# Patient Record
Sex: Male | Born: 1990 | Race: White | Hispanic: No | Marital: Single | Smoking: Never smoker
Health system: Southern US, Community
[De-identification: ages and names within clinical notes are randomized; demographics above are authoritative.]

## PROBLEM LIST (undated history)

## (undated) HISTORY — PX: NO PAST SURGERIES: SHX2092

---
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
## 2015-10-21 DIAGNOSIS — L309 Dermatitis, unspecified: Secondary | ICD-10-CM | POA: Insufficient documentation

## 2015-10-23 ENCOUNTER — Encounter: Payer: Self-pay | Admitting: Family Medicine

## 2015-10-23 ENCOUNTER — Ambulatory Visit (INDEPENDENT_AMBULATORY_CARE_PROVIDER_SITE_OTHER): Payer: BLUE CROSS/BLUE SHIELD | Admitting: Family Medicine

## 2015-10-23 VITALS — BP 124/80 | HR 64 | Temp 98.9°F | Resp 16 | Wt 195.0 lb

## 2015-10-23 DIAGNOSIS — K582 Mixed irritable bowel syndrome: Secondary | ICD-10-CM | POA: Diagnosis not present

## 2015-10-23 NOTE — Patient Instructions (Signed)
Diet for Irritable Bowel Syndrome  When you have irritable bowel syndrome (IBS), the foods you eat and your eating habits are very important. IBS may cause various symptoms, such as abdominal pain, constipation, or diarrhea. Choosing the right foods can help ease discomfort caused by these symptoms. Work with your health care provider and dietitian to find the best eating plan to help control your symptoms.  WHAT GENERAL GUIDELINES DO I NEED TO FOLLOW?  · Keep a food diary. This will help you identify foods that cause symptoms. Write down:  · What you eat and when.  · What symptoms you have.  · When symptoms occur in relation to your meals.  · Avoid foods that cause symptoms. Talk with your dietitian about other ways to get the same nutrients that are in these foods.  · Eat more foods that contain fiber. Take a fiber supplement if directed by your dietitian.  · Eat your meals slowly, in a relaxed setting.  · Aim to eat 5-6 small meals per day. Do not skip meals.  · Drink enough fluids to keep your urine clear or pale yellow.  · Ask your health care provider if you should take an over-the-counter probiotic during flare-ups to help restore healthy gut bacteria.  · If you have cramping or diarrhea, try making your meals low in fat and high in carbohydrates. Examples of carbohydrates are pasta, rice, whole grain breads and cereals, fruits, and vegetables.  · If dairy products cause your symptoms to flare up, try eating less of them. You might be able to handle yogurt better than other dairy products because it contains bacteria that help with digestion.  WHAT FOODS ARE NOT RECOMMENDED?  The following are some foods and drinks that may worsen your symptoms:  · Fatty foods, such as French fries.  · Milk products, such as cheese or ice cream.  · Chocolate.  · Alcohol.  · Products with caffeine, such as coffee.  · Carbonated drinks, such as soda.  The items listed above may not be a complete list of foods and beverages to  avoid. Contact your dietitian for more information.  WHAT FOODS ARE GOOD SOURCES OF FIBER?  Your health care provider or dietitian may recommend that you eat more foods that contain fiber. Fiber can help reduce constipation and other IBS symptoms. Add foods with fiber to your diet a little at a time so that your body can get used to them. Too much fiber at once might cause gas and swelling of your abdomen. The following are some foods that are good sources of fiber:  · Apples.  · Peaches.  · Pears.  · Berries.  · Figs.  · Broccoli (raw).  · Cabbage.  · Carrots.  · Raw peas.  · Kidney beans.  · Lima beans.  · Whole grain bread.  · Whole grain cereal.  FOR MORE INFORMATION   International Foundation for Functional Gastrointestinal Disorders: www.iffgd.org  National Institute of Diabetes and Digestive and Kidney Diseases: www.niddk.nih.gov/health-information/health-topics/digestive-diseases/ibs/Pages/facts.aspx     This information is not intended to replace advice given to you by your health care provider. Make sure you discuss any questions you have with your health care provider.     Document Released: 03/04/2004 Document Revised: 01/03/2015 Document Reviewed: 03/15/2014  Elsevier Interactive Patient Education ©2016 [employer]  Irritable Bowel Syndrome, Adult  Irritable bowel syndrome (IBS) is not one specific disease. It is a group of symptoms that affects the organs responsible for digestion (gastrointestinal   or GI tract).   To regulate how your GI tract works, your body sends signals back and forth between your intestines and your brain. If you have IBS, there may be a problem with these signals. As a result, your GI tract does not function normally. Your intestines may become more sensitive and overreact to certain things. This is especially true when you eat certain foods or when you are under stress.   There are four types of IBS. These may be determined based on the consistency of your stool:   · IBS with  diarrhea.    · IBS with constipation.    · Mixed IBS.    · Unsubtyped IBS.    It is important to know which type of IBS you have. Some treatments are more likely to be helpful for certain types of IBS.   CAUSES   The exact cause of IBS is not known.  RISK FACTORS  You may have a higher risk of IBS if:  · You are a woman.  · You are younger than 24 years old.  · You have a family history of IBS.  · You have mental health problems.  · You have had bacterial infection of your GI tract.  SIGNS AND SYMPTOMS   Symptoms of IBS vary from person to person. The main symptom is abdominal pain or discomfort. Additional symptoms usually include one or more of the following:   · Diarrhea, constipation, or both.    · Abdominal swelling or bloating.    · Feeling full or sick after eating a small or regular-size meal.    · Frequent gas.    · Mucus in the stool.    · A feeling of having more stool left after a bowel movement.    Symptoms tend to come and go. They may be associated with stress, psychiatric conditions, or nothing at all.   DIAGNOSIS   There is no specific test to diagnose IBS. Your health care provider will make a diagnosis based on a physical exam, medical history, and your symptoms. You may have other tests to rule out other conditions that may be causing your symptoms. These may include:   · Blood tests.    · X-rays.    · CT scan.  · Endoscopy and colonoscopy. This is a test in which your GI tract is viewed with a long, thin, flexible tube.  TREATMENT  There is no cure for IBS, but treatment can help relieve symptoms. IBS treatment often includes:   · Changes to your diet, such as:    Eating more fiber.    Avoiding foods that cause symptoms.    Drinking more water.    Eating regular, medium-sized portioned meals.  · Medicines. These may include:    Fiber supplements if you have constipation.    Medicine to control diarrhea (antidiarrheal medicines).    Medicine to help control muscle spasms in your GI tract  (antispasmodic medicines).    Medicines to help with any mental health issues, such as antidepressants or tranquilizers.  · Therapy.    Talk therapy may help with anxiety, depression, or other mental health issues that can make IBS symptoms worse.  · Stress reduction.    Managing your stress can help keep symptoms under control.  HOME CARE INSTRUCTIONS   · Take medicines only as directed by your health care provider.  · Eat a healthy diet.    Avoid foods and drinks with added sugar.    Include more whole grains, fruits, and vegetables gradually into your diet.   This may be especially helpful if you have IBS with constipation.    Avoid any foods and drinks that make your symptoms worse. These may include dairy products and caffeinated or carbonated drinks.    Do not eat large meals.    Drink enough fluid to keep your urine clear or pale yellow.  · Exercise regularly. Ask your health care provider for recommendations of good activities for you.  · Keep all follow-up visits as directed by your health care provider. This is important.  SEEK MEDICAL CARE IF:   · You have constant pain.  · You have trouble or pain with swallowing.  · You have worsening diarrhea.  SEEK IMMEDIATE MEDICAL CARE IF:   · You have severe and worsening abdominal pain.    · You have diarrhea and:      You have a rash, stiff neck, or severe headache.      You are irritable, sleepy, or difficult to awaken.      You are weak, dizzy, or extremely thirsty.    · You have bright red blood in your stool or you have black tarry stools.    · You have unusual abdominal swelling that is painful.    · You vomit continuously.    · You vomit blood (hematemesis).    · You have both abdominal pain and a fever.         This information is not intended to replace advice given to you by your health care provider. Make sure you discuss any questions you have with your health care provider.     Document Released: 12/13/2005 Document Revised: 01/03/2015 Document  Reviewed: 08/30/2014  Elsevier Interactive Patient Education ©2016 [employer] Inc[employer]

## 2015-10-23 NOTE — Progress Notes (Signed)
Patient ID: Stanley Johnson, male   DOB: 02/21/1991, 24 y.o.   MRN: [id number] Name: Stanley Johnson   MRN: [id number]    DOB: 05/04/1991   Date:10/23/2015       Progress Note  Subjective  Chief Complaint  Chief Complaint  Patient presents with  . Abdominal Pain    Abdominal Pain This is a recurrent problem. The current episode started 1 to 4 weeks ago. The problem occurs every several days. Duration: 2-3 times a day. The problem has been unchanged. The pain is located in the generalized abdominal region. The quality of the pain is cramping and a sensation of fullness. The abdominal pain does not radiate. Associated symptoms include constipation, diarrhea, flatus and frequency. Pertinent negatives include no hematochezia or vomiting. Nothing aggravates the pain. The pain is relieved by bowel movements.   Patient Active Problem List   Diagnosis Date Noted  . 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 Breast enlargement 12/16/2008   Past Surgical History  Procedure Laterality Date  . No past surgeries     Family History  Problem Relation Age of Onset  . Healthy Mother   . Healthy Father   . Healthy Brother   . Alcohol abuse Maternal Grandfather   . Arthritis Paternal Grandmother   . Hypertension Paternal Grandmother   . Hypertension Paternal Grandfather    Social History  Substance Use Topics  . Smoking status: Never Smoker   . Smokeless tobacco: Never Used  . Alcohol Use: 1-2 beers a week    Current outpatient prescriptions:  .  cetirizine (ZYRTEC) 10 MG tablet, Take 10 mg by mouth daily., Disp: , Rfl:  .  fluticasone (FLONASE) 50 MCG/ACT nasal spray, Place into both nostrils daily., Disp: , Rfl:   No Known Allergies  Review of Systems  Constitutional: Negative.   HENT: Negative.   Eyes: Negative.   Respiratory: Negative.   Cardiovascular: Negative.   Gastrointestinal: Positive for abdominal pain, diarrhea, constipation and flatus. Negative for vomiting and hematochezia.   Genitourinary: Positive for frequency.  Musculoskeletal: Negative.   Skin: Negative.   Neurological: Negative.   Endo/Heme/Allergies: Negative.   Psychiatric/Behavioral: Negative.    Objective  Filed Vitals:   10/23/15 0811  BP: 124/80  Pulse: 64  Temp: 98.9 F (37.2 C)  TempSrc: Oral  Resp: 16  Weight: 195 lb (88.451 kg)  SpO2: 98%   Physical Exam  Constitutional: He is oriented to person, place, and time and well-developed, well-nourished, and in no distress.  HENT:  Head: Normocephalic and atraumatic.  Right Ear: External ear normal.  Left Ear: External ear normal.  Eyes: Conjunctivae and EOM are normal.  Neck: Neck supple.  Cardiovascular: Normal rate.   Pulmonary/Chest: Effort normal and breath sounds normal.  Abdominal: Soft. Bowel sounds are normal. He exhibits no mass. There is no tenderness. There is no rebound and no guarding.  Musculoskeletal: Normal range of motion.  Neurological: He is alert and oriented to person, place, and time.  Psychiatric: Mood, affect and judgment normal.    Assessment & Plan  1. Irritable bowel syndrome with both constipation and diarrhea Alternating constipation and diarrhea over the past 2-3 weeks. Fiber has helped. Suspect IBS and recommend probiotic with extra fiber in diet. May add Gas-X prn or Imodium-AD prn. Will get routine labs and consider GI referral if symptoms persist or worsen. Recheck pending lab reports. - COMPLETE METABOLIC PANEL WITH GFR - CBC with Differential/Platelet - TSH

## 2016-05-13 ENCOUNTER — Ambulatory Visit (INDEPENDENT_AMBULATORY_CARE_PROVIDER_SITE_OTHER): Payer: BLUE CROSS/BLUE SHIELD | Admitting: Family Medicine

## 2016-05-13 ENCOUNTER — Encounter: Payer: Self-pay | Admitting: Family Medicine

## 2016-05-13 VITALS — BP 148/90 | HR 92 | Temp 98.8°F | Resp 16 | Wt 198.0 lb

## 2016-05-13 DIAGNOSIS — R350 Frequency of micturition: Secondary | ICD-10-CM

## 2016-05-13 DIAGNOSIS — N451 Epididymitis: Secondary | ICD-10-CM

## 2016-05-13 LAB — POCT URINALYSIS DIPSTICK
BILIRUBIN UA: NEGATIVE
Glucose, UA: NEGATIVE
Ketones, UA: NEGATIVE
LEUKOCYTES UA: NEGATIVE
NITRITE UA: NEGATIVE
PH UA: 6
RBC UA: NEGATIVE
Spec Grav, UA: 1.01
UROBILINOGEN UA: 0.2

## 2016-05-13 MED ORDER — DOXYCYCLINE HYCLATE 100 MG PO TABS: 100.0000 mg | ORAL_TABLET | Freq: Two times a day (BID) | ORAL | Status: DC

## 2016-05-13 NOTE — Patient Instructions (Signed)
Epididymitis °Epididymitis is swelling (inflammation) of the epididymis. The epididymis is a cord-like structure that is located along the top and back part of the testicle. It collects and stores sperm from the testicle. °This condition can also cause pain and swelling of the testicle and scrotum. Symptoms usually start suddenly (acute epididymitis). Sometimes epididymitis starts gradually and lasts for a while (chronic epididymitis). This type may be harder to treat. °CAUSES °In men 35 and younger, this condition is usually caused by a bacterial infection or sexually transmitted disease (STD), such as: °· Gonorrhea. °· Chlamydia.   °In men 35 and older who do not have anal sex, this condition is usually caused by bacteria from a blockage or abnormalities in the urinary system. These can result from: °· Having a tube placed into the bladder (urinary catheter). °· Having an enlarged or inflamed prostate gland. °· Having recent urinary tract surgery. °In men who have a condition that weakens the body's defense system (immune system), such as HIV, this condition can be caused by:  °· Other bacteria, including tuberculosis and syphilis. °· Viruses. °· Fungi. °Sometimes this condition occurs without infection. That may happen if urine flows backward into the epididymis after heavy lifting or straining. °RISK FACTORS °This condition is more likely to develop in men: °· Who have unprotected sex with more than one partner. °· Who have anal sex.   °· Who have recently had surgery.   °· Who have a urinary catheter. °· Who have urinary problems. °· Who have a suppressed immune system.   °SYMPTOMS  °This condition usually begins suddenly with chills, fever, and pain behind the scrotum and in the testicle. Other symptoms include:  °· Swelling of the scrotum, testicle, or both. °· Pain when ejaculating or urinating. °· Pain in the back or belly. °· Nausea. °· Itching and discharge from the penis. °· Frequent need to pass  urine. °· Redness and tenderness of the scrotum. °DIAGNOSIS °Your health care provider can diagnose this condition based on your symptoms and medical history. Your health care provider will also do a physical exam to ask about your symptoms and check your scrotum and testicle for swelling, pain, and redness. You may also have other tests, including:   °· Examination of discharge from the penis. °· Urine tests for infections, such as STDs.   °Your health care provider may test you for other STDs, including HIV.  °TREATMENT °Treatment for this condition depends on the cause. If your condition is caused by a bacterial infection, oral antibiotic medicine may be prescribed. If the bacterial infection has spread to your blood, you may need to receive IV antibiotics. Nonbacterial epididymitis is treated with home care that includes bed rest and elevation of the scrotum. °Surgery may be needed to treat: °· Bacterial epididymitis that causes pus to build up in the scrotum (abscess). °· Chronic epididymitis that has not responded to other treatments. °HOME CARE INSTRUCTIONS °Medicines  °· Take over-the-counter and prescription medicines only as told by your health care provider.   °· If you were prescribed an antibiotic medicine, take it as told by your health care provider. Do not stop taking the antibiotic even if your condition improves. °Sexual Activity  °· If your epididymitis was caused by an STD, avoid sexual activity until your treatment is complete. °· Inform your sexual partner or partners if you test positive for an STD. They may need to be treated. Do not engage in sexual activity with your partner or partners until their treatment is completed. °General Instructions  °· Return to your normal activities as told   by your health care provider. Ask your health care provider what activities are safe for you. °· Keep your scrotum elevated and supported while resting. Ask your health care provider if you should wear a  scrotal support, such as a jockstrap. Wear it as told by your health care provider. °· If directed, apply ice to the affected area:   °¨ Put ice in a plastic bag. °¨ Place a towel between your skin and the bag. °¨ Leave the ice on for 20 minutes, 2-3 times per day. °· Try taking a sitz bath to help with discomfort. This is a warm water bath that is taken while you are sitting down. The water should only come up to your hips and should cover your buttocks. Do this 3-4 times per day or as told by your health care provider. °· Keep all follow-up visits as told by your health care provider. This is important. °SEEK MEDICAL CARE IF:  °· You have a fever.   °· Your pain medicine is not helping.   °· Your pain is getting worse.   °· Your symptoms do not improve within three days. °  °This information is not intended to replace advice given to you by your health care provider. Make sure you discuss any questions you have with your health care provider. °  °Document Released: 12/10/2000 Document Revised: 09/03/2015 Document Reviewed: 04/30/2015 °Elsevier Interactive Patient Education ©2016 [employer] Inc. ° °

## 2016-05-13 NOTE — Progress Notes (Signed)
Patient ID: Stanley Johnson, male   DOB: [birth year], 25 y.o.   MRN: [id number]       Patient: Stanley Johnson Male    DOB: [birth year]   25 y.o.   MRN: [id number] Visit Date: 05/13/2016  Today's Provider: Dortha Kern, PA   Chief Complaint  Patient presents with  . Urinary Frequency  . Testicle Pain   Subjective:    HPI  Patient has had issues with frequent urination especially in the past week. He has to use the bathroom every 20 to 30 minutes. Urgency is present. He has noticed in the past 2 days that his right testicle was tender but no redness or rash present. He took Ibuprofen 2 times and the discomfort sensation resolved but then last night or this morning noticed his left testicle having some discomfort now. He has not noticed any blood or discharge. He has not been sexually active in 1 year now. He has had nocturia for the past 2 months or so.    No past medical history on file. Patient Active Problem List   Diagnosis Date Noted  . 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 Breast enlargement 12/16/2008   Past Surgical History  Procedure Laterality Date  . No past surgeries     Family History  Problem Relation Age of Onset  . Healthy Mother   . Healthy Father   . Healthy Brother   . Alcohol abuse Maternal Grandfather   . Arthritis Paternal Grandmother   . Hypertension Paternal Grandmother   . Hypertension Paternal Grandfather     No Known Allergies   Previous Medications   CETIRIZINE (ZYRTEC) 10 MG TABLET    Take 10 mg by mouth daily.   FLUTICASONE (FLONASE) 50 MCG/ACT NASAL SPRAY    Place into both nostrils daily.    Review of Systems  Constitutional: Negative.   Respiratory: Negative.   Cardiovascular: Negative.   Genitourinary: Positive for urgency, frequency and testicular pain.    Social History  Substance Use Topics  . Smoking status: Never Smoker   . Smokeless tobacco: Never Used  . Alcohol Use: No   Objective:   BP 148/90 mmHg  Pulse 92  Temp(Src) 98.8 F  (37.1 C)  Resp 16  Wt 198 lb (89.812 kg) BP Readings from Last 3 Encounters:  05/13/16 148/90  10/23/15 124/80   Urinalysis    Component Value Date/Time   BILIRUBINUR negative 05/13/2016 0821   PROTEINUR trace 05/13/2016 0821   UROBILINOGEN 0.2 05/13/2016 0821   NITRITE negative 05/13/2016 0821   LEUKOCYTESUR Negative 05/13/2016 0821     Physical Exam  Constitutional: He is oriented to person, place, and time. He appears well-developed and well-nourished. No distress.  HENT:  Head: Normocephalic and atraumatic.  Right Ear: Hearing normal.  Left Ear: Hearing normal.  Nose: Nose normal.  Eyes: Conjunctivae and lids are normal. Right eye exhibits no discharge. Left eye exhibits no discharge. No scleral icterus.  Cardiovascular: Normal rate and regular rhythm.   Pulmonary/Chest: Effort normal and breath sounds normal. No respiratory distress.  Abdominal: Soft. Bowel sounds are normal.  Genitourinary: Penis normal.  Mild soreness in left upper spermatic cords and upper posterior pole of left testicle.  Musculoskeletal: Normal range of motion.  Neurological: He is alert and oriented to person, place, and time.  Skin: Skin is intact. No lesion and no rash noted.  Psychiatric: He has a normal mood and affect. His speech is normal and behavior is normal. Thought content normal.  Assessment & Plan:     1. Frequent urination Polyuria without discomfort. No known family history of diabetes. Will check labs. Urinalysis did not show any blood or signs of infection. Recheck pending reports.  - POCT urinalysis dipstick - CBC with Differential/Platelet - Comprehensive metabolic panel - Hemoglobin A1c  2. Epididymitis Tenderness in the left testicle/spermatic cords. Suspect epididymitis. Will treat with Doxycycline BID and Ibuprofen prn. Hot soaks may be helpful, also. Recheck prn.       Dortha Kernennis Chrismon, PA  [facility]

## 2016-05-14 LAB — CBC WITH DIFFERENTIAL/PLATELET
BASOS ABS: 0 10*3/uL (ref 0.0–0.2)
BASOS: 0 %
EOS (ABSOLUTE): 0 10*3/uL (ref 0.0–0.4)
Eos: 1 %
HEMOGLOBIN: 15.4 g/dL (ref 12.6–17.7)
Hematocrit: 43.7 % (ref 37.5–51.0)
IMMATURE GRANS (ABS): 0 10*3/uL (ref 0.0–0.1)
Immature Granulocytes: 0 %
LYMPHS ABS: 1.1 10*3/uL (ref 0.7–3.1)
Lymphs: 27 %
MCH: 32.3 pg (ref 26.6–33.0)
MCHC: 35.2 g/dL (ref 31.5–35.7)
MCV: 92 fL (ref 79–97)
MONOCYTES: 10 %
Monocytes Absolute: 0.4 10*3/uL (ref 0.1–0.9)
NEUTROS ABS: 2.6 10*3/uL (ref 1.4–7.0)
Neutrophils: 62 %
Platelets: 142 10*3/uL — ABNORMAL LOW (ref 150–379)
RBC: 4.77 x10E6/uL (ref 4.14–5.80)
RDW: 13.4 % (ref 12.3–15.4)
WBC: 4.2 10*3/uL (ref 3.4–10.8)

## 2016-05-14 LAB — COMPREHENSIVE METABOLIC PANEL
ALBUMIN: 4.8 g/dL (ref 3.5–5.5)
ALK PHOS: 53 IU/L (ref 39–117)
ALT: 21 IU/L (ref 0–44)
AST: 19 IU/L (ref 0–40)
Albumin/Globulin Ratio: 1.9 (ref 1.2–2.2)
BUN / CREAT RATIO: 10 (ref 9–20)
BUN: 10 mg/dL (ref 6–20)
Bilirubin Total: 0.6 mg/dL (ref 0.0–1.2)
CO2: 25 mmol/L (ref 18–29)
CREATININE: 0.99 mg/dL (ref 0.76–1.27)
Calcium: 9.6 mg/dL (ref 8.7–10.2)
Chloride: 102 mmol/L (ref 96–106)
GFR calc Af Amer: 122 mL/min/{1.73_m2} (ref >59)
GFR, EST NON AFRICAN AMERICAN: 105 mL/min/{1.73_m2} (ref >59)
GLUCOSE: 82 mg/dL (ref 65–99)
Globulin, Total: 2.5 g/dL (ref 1.5–4.5)
Potassium: 4.2 mmol/L (ref 3.5–5.2)
SODIUM: 142 mmol/L (ref 134–144)
TOTAL PROTEIN: 7.3 g/dL (ref 6.0–8.5)

## 2016-05-14 LAB — HEMOGLOBIN A1C
Est. average glucose Bld gHb Est-mCnc: 111 mg/dL
HEMOGLOBIN A1C: 5.5 % (ref 4.8–5.6)

## 2017-01-10 ENCOUNTER — Encounter: Payer: Self-pay | Admitting: Family Medicine

## 2017-01-10 ENCOUNTER — Ambulatory Visit (INDEPENDENT_AMBULATORY_CARE_PROVIDER_SITE_OTHER): Payer: BLUE CROSS/BLUE SHIELD | Admitting: Family Medicine

## 2017-01-10 VITALS — BP 142/98 | HR 73 | Temp 98.0°F | Resp 14 | Ht 72.0 in | Wt 198.0 lb

## 2017-01-10 DIAGNOSIS — R030 Elevated blood-pressure reading, without diagnosis of hypertension: Secondary | ICD-10-CM | POA: Diagnosis not present

## 2017-01-10 DIAGNOSIS — L739 Follicular disorder, unspecified: Secondary | ICD-10-CM

## 2017-01-10 NOTE — Progress Notes (Addendum)
   Patient: Stanley Johnson Male    DOB: 10/08/1991   25 y.o.   MRN: [id number] Visit Date: 01/10/2017  Today's Provider: Dortha Kernennis Chrismon, PA   Chief Complaint  Patient presents with  . Skin Problem   Subjective:    HPI Patient is here today for a red raise area above left eye lid. Patient reports noticing the spot around 2 weeks ago. He has tried warm compresses which have helped.  Patient has a PMH of calcium build up and wanted to be sure problem is not reoccurring.   Patient Active Problem List   Diagnosis Date Noted  . 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 Breast enlargement 12/16/2008   Past Surgical History:  Procedure Laterality Date  . NO PAST SURGERIES     Family History  Problem Relation Age of Onset  . Healthy Mother   . Healthy Father   . Healthy Brother   . Alcohol abuse Maternal Grandfather   . Arthritis Paternal Grandmother   . Hypertension Paternal Grandmother   . Hypertension Paternal Grandfather    No Known Allergies   Previous Medications   CETIRIZINE (ZYRTEC) 10 MG TABLET    Take 10 mg by mouth daily.   FLUTICASONE (FLONASE) 50 MCG/ACT NASAL SPRAY    Place into both nostrils daily.    Review of Systems  Constitutional: Negative.   Respiratory: Negative.   Cardiovascular: Negative.   Skin:       Red raise area above left eye lid     Social History  Substance Use Topics  . Smoking status: Never Smoker  . Smokeless tobacco: Never Used  . Alcohol use No   Objective:   BP (!) 142/98 (BP Location: Right Arm, Patient Position: Sitting, Cuff Size: Normal)   Pulse 73   Temp 98 F (36.7 C) (Oral)   Resp 14   Ht 6' (1.829 m)   Wt 198 lb (89.8 kg)   SpO2 99%   BMI 26.85 kg/m  BP Readings from Last 3 Encounters:  01/10/17 (!) 142/98  05/13/16 (!) 148/90  10/23/15 124/80   Wt Readings from Last 3 Encounters:  01/10/17 198 lb (89.8 kg)  05/13/16 198 lb (89.8 kg)  10/23/15 195 lb (88.5 kg)    Physical Exam  Constitutional: He is oriented to person,  place, and time. He appears well-developed and well-nourished. No distress.  HENT:  Head: Normocephalic and atraumatic.  Right Ear: Hearing normal.  Left Ear: Hearing normal.  Nose: Nose normal.  Eyes: Conjunctivae, EOM and lids are normal. Right eye exhibits no discharge. Left eye exhibits no discharge. No scleral icterus.  Neck: Neck supple.  Pulmonary/Chest: Effort normal. No respiratory distress.  Musculoskeletal: Normal range of motion.  Neurological: He is alert and oriented to person, place, and time.  Skin: Skin is intact. No lesion noted.  Pinpoint pustule with cystic reaction subcutaneously. Surrounding erythema approximately 0.5 cm in diameter. No local lymphadenopathy.   Psychiatric: He has a normal mood and affect. His speech is normal and behavior is normal. Thought content normal.      Assessment & Plan:      1. Folliculitis Early folliculitis versus acne cyst in the left eyebrow. May use Neosporin cream and warm compresses prn. Recheck if it does not continue to resolve in the next week.  2. Elevated BP without diagnosis of hypertension Recommend restricted sodium/salt intake, decaffeinate beverages, no ETOH or tobacco use. Exercise regularly and recheck BP in 2 months.

## 2017-01-17 ENCOUNTER — Ambulatory Visit: Payer: BLUE CROSS/BLUE SHIELD | Admitting: Family Medicine

## 2017-03-10 ENCOUNTER — Encounter: Payer: Self-pay | Admitting: Family Medicine

## 2017-03-10 ENCOUNTER — Ambulatory Visit (INDEPENDENT_AMBULATORY_CARE_PROVIDER_SITE_OTHER): Payer: BLUE CROSS/BLUE SHIELD | Admitting: Family Medicine

## 2017-03-10 VITALS — BP 152/98 | HR 84 | Temp 98.2°F | Resp 16 | Wt 193.0 lb

## 2017-03-10 DIAGNOSIS — I10 Essential (primary) hypertension: Secondary | ICD-10-CM

## 2017-03-10 MED ORDER — LISINOPRIL 5 MG PO TABS: 5.0000 mg | ORAL_TABLET | Freq: Every day | ORAL | 3 refills | Status: DC

## 2017-03-10 NOTE — Patient Instructions (Signed)

## 2017-03-10 NOTE — Progress Notes (Signed)
Patient: Stanley Johnson Male    DOB: Apr 01, 1991   26 y.o.   MRN: [id number] Visit Date: 03/10/2017  Today's Provider: Dortha Kern, PA   Chief Complaint  Patient presents with  . Elevated Blood Pressure   Subjective:    HPI     Follow up for Elevated Blood Pressure without Diagnosis of Hypertension  The patient was last seen for this 2 months ago. Changes made at last visit include restrict sodium/salt intake, use decaffeinated beverages, no ETOH of tobacco use.  He reports fair compliance with treatment. Pt has decreased alcohol use, and he has increased his time at the gym. Is now going 3 times per week for 30-60 minutes. Pt mostly does cardio while at the gym. Pt also reports that his stress level seems decreased.   ------------------------------------------------------------------------------------  Patient Active Problem List   Diagnosis Date Noted  . 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 Breast enlargement 12/16/2008   Past Surgical History:  Procedure Laterality Date  . NO PAST SURGERIES      Family History  Problem Relation Age of Onset  . Healthy Mother   . Healthy Father   . Healthy Brother   . Alcohol abuse Maternal Grandfather   . Arthritis Paternal Grandmother   . Hypertension Paternal Grandmother   . Hypertension Paternal Grandfather    No Known Allergies  Current Outpatient Prescriptions:  .  cetirizine (ZYRTEC) 10 MG tablet, Take 10 mg by mouth daily., Disp: , Rfl:  .  FIBER ADULT GUMMIES 2 g CHEW, Chew by mouth., Disp: , Rfl:  .  fluticasone (FLONASE) 50 MCG/ACT nasal spray, Place into both nostrils daily., Disp: , Rfl:  .  Probiotic Product (PROBIOTIC ADVANCED PO), Take by mouth., Disp: , Rfl:   Review of Systems  Constitutional: Negative for activity change, appetite change, chills, diaphoresis, fatigue, fever and unexpected weight change.  Respiratory: Negative for shortness of breath.   Cardiovascular: Negative for chest pain, palpitations  and leg swelling.  Neurological: Negative for light-headedness and headaches.    Social History  Substance Use Topics  . Smoking status: Never Smoker  . Smokeless tobacco: Never Used  . Alcohol use 0.0 oz/week     Comment: occasionally   Objective:   BP (!) 152/98 (BP Location: Right Arm, Patient Position: Sitting, Cuff Size: Normal)   Pulse 84   Temp 98.2 F (36.8 C) (Oral)   Resp 16   Wt 193 lb (87.5 kg)   BMI 26.18 kg/m  Vitals:   03/10/17 0810  BP: (!) 152/98  Pulse: 84  Resp: 16  Temp: 98.2 F (36.8 C)  TempSrc: Oral  Weight: 193 lb (87.5 kg)     Physical Exam  Constitutional: He appears well-developed and well-nourished.  HENT:  Head: Normocephalic.  Eyes: Conjunctivae are normal.  Neck: Neck supple.  Cardiovascular: Normal rate and regular rhythm.   Pulmonary/Chest: Effort normal and breath sounds normal.  Abdominal: Soft. Bowel sounds are normal.      Assessment & Plan:     1. Essential hypertension Has been exercising, limiting caffeine and sodium in diet but BP still elevated. Will start Lisinopril and recheck in 6 weeks. Schedule fasting labs next week. - lisinopril (PRINIVIL,ZESTRIL) 5 MG tablet; Take 1 tablet (5 mg total) by mouth daily.  Dispense: 30 tablet; Refill: 3 - CBC with Differential/Platelet - Comprehensive metabolic panel - TSH - Lipid panel      Patient seen and examined by Julieanne Manson, MD, and  note scribed by Allene DillonEmily Drozdowski, CMA.  Dortha Kernennis Abem Shaddix, PA  [facility]

## 2017-03-18 ENCOUNTER — Telehealth: Payer: Self-pay

## 2017-03-18 LAB — COMPREHENSIVE METABOLIC PANEL
ALBUMIN: 4.8 g/dL (ref 3.5–5.5)
ALT: 21 IU/L (ref 0–44)
AST: 23 IU/L (ref 0–40)
Albumin/Globulin Ratio: 1.9 (ref 1.2–2.2)
Alkaline Phosphatase: 54 IU/L (ref 39–117)
BUN / CREAT RATIO: 9 (ref 9–20)
BUN: 8 mg/dL (ref 6–20)
Bilirubin Total: 0.6 mg/dL (ref 0.0–1.2)
CALCIUM: 9.6 mg/dL (ref 8.7–10.2)
CO2: 23 mmol/L (ref 18–29)
CREATININE: 0.86 mg/dL (ref 0.76–1.27)
Chloride: 103 mmol/L (ref 96–106)
GFR calc non Af Amer: 120 mL/min/{1.73_m2} (ref >59)
GFR, EST AFRICAN AMERICAN: 138 mL/min/{1.73_m2} (ref >59)
GLUCOSE: 88 mg/dL (ref 65–99)
Globulin, Total: 2.5 g/dL (ref 1.5–4.5)
Potassium: 4.7 mmol/L (ref 3.5–5.2)
Sodium: 142 mmol/L (ref 134–144)
TOTAL PROTEIN: 7.3 g/dL (ref 6.0–8.5)

## 2017-03-18 LAB — CBC WITH DIFFERENTIAL/PLATELET
BASOS ABS: 0 10*3/uL (ref 0.0–0.2)
Basos: 0 %
EOS (ABSOLUTE): 0.1 10*3/uL (ref 0.0–0.4)
Eos: 1 %
Hematocrit: 43.4 % (ref 37.5–51.0)
Hemoglobin: 14.7 g/dL (ref 13.0–17.7)
IMMATURE GRANS (ABS): 0 10*3/uL (ref 0.0–0.1)
Immature Granulocytes: 0 %
LYMPHS: 39 %
Lymphocytes Absolute: 2.1 10*3/uL (ref 0.7–3.1)
MCH: 31.5 pg (ref 26.6–33.0)
MCHC: 33.9 g/dL (ref 31.5–35.7)
MCV: 93 fL (ref 79–97)
Monocytes Absolute: 0.6 10*3/uL (ref 0.1–0.9)
Monocytes: 12 %
Neutrophils Absolute: 2.5 10*3/uL (ref 1.4–7.0)
Neutrophils: 48 %
Platelets: 147 10*3/uL — ABNORMAL LOW (ref 150–379)
RBC: 4.66 x10E6/uL (ref 4.14–5.80)
RDW: 13 % (ref 12.3–15.4)
WBC: 5.3 10*3/uL (ref 3.4–10.8)

## 2017-03-18 LAB — LIPID PANEL
CHOL/HDL RATIO: 2.9 ratio (ref 0.0–5.0)
Cholesterol, Total: 133 mg/dL (ref 100–199)
HDL: 46 mg/dL (ref >39)
LDL Calculated: 65 mg/dL (ref 0–99)
Triglycerides: 112 mg/dL (ref 0–149)
VLDL CHOLESTEROL CAL: 22 mg/dL (ref 5–40)

## 2017-03-18 LAB — TSH: TSH: 1.02 u[IU]/mL (ref 0.450–4.500)

## 2017-03-18 NOTE — Telephone Encounter (Signed)
Pt advised.   Thanks,   -Kelvyn Schunk  

## 2017-03-18 NOTE — Telephone Encounter (Signed)
-----   Message from Tamsen Roersennis E Chrismon, [place] sent at 03/18/2017  8:43 AM EDT ----- All blood tests essentially normal. Only a slight variation of platelets - not abnormal. Proceed with medications as prescribed and keep appointment on 04-22-17 for follow up of BP.

## 2017-04-22 ENCOUNTER — Encounter: Payer: Self-pay | Admitting: Family Medicine

## 2017-04-22 ENCOUNTER — Ambulatory Visit (INDEPENDENT_AMBULATORY_CARE_PROVIDER_SITE_OTHER): Payer: BLUE CROSS/BLUE SHIELD | Admitting: Family Medicine

## 2017-04-22 DIAGNOSIS — I10 Essential (primary) hypertension: Secondary | ICD-10-CM

## 2017-04-22 NOTE — Progress Notes (Signed)
Patient: Stanley Johnson Male    DOB: 1991/10/13   26 y.o.   MRN: [id number] Visit Date: 04/22/2017  Today's Provider: Dortha Kern, PA   Chief Complaint  Patient presents with  . Hypertension   Subjective:    HPI  Hypertension, follow-up: Patient returns to office today for one month follow up, last office visit was 03/10/17 patient was started on Lisinopril . Patient reports he is feeling well today and has good compliance and tolerance on medication.  BP Readings from Last 3 Encounters:  04/22/17 130/72  03/10/17 (!) 152/98  01/10/17 (!) 142/98    He was last seen for hypertension 1 months ago.  BP at that visit was 130/72. Management since that visit includes none.He reports excellent compliance with treatment. He is not having side effects.  He is exercising. He is adherent to low salt diet.   Outside blood pressures are not currently being checked. He is not experiencing symptoms of chest pain, dyspnea, headache or palpitations .  Patient denies any adverse reaction or side effects.   Cardiovascular risk factors include hypertension.  Use of agents associated with hypertension: none.    Patient Active Problem List   Diagnosis Date Noted  . 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 Breast enlargement 12/16/2008   Past Surgical History:  Procedure Laterality Date  . NO PAST SURGERIES     Family History  Problem Relation Age of Onset  . Healthy Mother   . Healthy Father   . Healthy Brother   . Alcohol abuse Maternal Grandfather   . Arthritis Paternal Grandmother   . Hypertension Paternal Grandmother   . Hypertension Paternal Grandfather     No Known Allergies   Current Outpatient Prescriptions:  .  cetirizine (ZYRTEC) 10 MG tablet, Take 10 mg by mouth daily., Disp: , Rfl:  .  FIBER ADULT GUMMIES 2 g CHEW, Chew by mouth., Disp: , Rfl:  .  fluticasone (FLONASE) 50 MCG/ACT nasal spray, Place into both nostrils daily., Disp: , Rfl:  .  lisinopril  (PRINIVIL,ZESTRIL) 5 MG tablet, Take 1 tablet (5 mg total) by mouth daily., Disp: 30 tablet, Rfl: 3 .  Probiotic Product (PROBIOTIC ADVANCED PO), Take by mouth., Disp: , Rfl:   Review of Systems  Constitutional: Negative.   HENT: Negative.   Eyes: Negative.   Respiratory: Negative.   Cardiovascular: Negative.   Gastrointestinal: Negative.   Endocrine: Negative.   Genitourinary: Negative.   Musculoskeletal: Negative.   Skin: Negative.   Allergic/Immunologic: Negative.   Neurological: Negative.   Hematological: Negative.   Psychiatric/Behavioral: Negative.     Social History  Substance Use Topics  . Smoking status: Never Smoker  . Smokeless tobacco: Never Used  . Alcohol use 0.0 oz/week     Comment: occasionally   Objective:   BP 130/72   Pulse 82   Temp 98.1 F (36.7 C) (Oral)   Resp 16   Wt 191 lb 9.6 oz (86.9 kg)   SpO2 98%   BMI 25.99 kg/m  Vitals:   04/22/17 0813  BP: 130/72  Pulse: 82  Resp: 16  Temp: 98.1 F (36.7 C)  TempSrc: Oral  SpO2: 98%  Weight: 191 lb 9.6 oz (86.9 kg)    Physical Exam  Constitutional: He is oriented to person, place, and time. He appears well-developed and well-nourished. No distress.  HENT:  Head: Normocephalic and atraumatic.  Right Ear: Hearing normal.  Left Ear: Hearing normal.  Nose: Nose normal.  Eyes:  Conjunctivae and lids are normal. Right eye exhibits no discharge. Left eye exhibits no discharge. No scleral icterus.  Neck: Neck supple.  Cardiovascular: Normal rate and regular rhythm.   Pulmonary/Chest: Effort normal and breath sounds normal. No respiratory distress.  Musculoskeletal: Normal range of motion.  Neurological: He is alert and oriented to person, place, and time.  Skin: Skin is intact. No lesion and no rash noted.  Psychiatric: He has a normal mood and affect. His speech is normal and behavior is normal. Thought content normal.       Assessment & Plan:     1. Essential hypertension Well controlled  and tolerating Lisinopril 5 mg qd without side effects. Continue dietary and lifestyle recommendations. Recheck in 6 months. Continue this dose.       Dortha Kern, PA  [facility]

## 2017-06-23 ENCOUNTER — Other Ambulatory Visit: Payer: Self-pay | Admitting: Family Medicine

## 2017-06-23 DIAGNOSIS — I10 Essential (primary) hypertension: Secondary | ICD-10-CM

## 2017-10-19 ENCOUNTER — Other Ambulatory Visit: Payer: Self-pay | Admitting: Family Medicine

## 2017-10-19 DIAGNOSIS — I10 Essential (primary) hypertension: Secondary | ICD-10-CM

## 2017-10-20 ENCOUNTER — Ambulatory Visit: Payer: BLUE CROSS/BLUE SHIELD | Admitting: Family Medicine

## 2017-12-28 ENCOUNTER — Other Ambulatory Visit: Payer: Self-pay | Admitting: Family Medicine

## 2017-12-28 ENCOUNTER — Telehealth: Payer: Self-pay | Admitting: Family Medicine

## 2017-12-28 DIAGNOSIS — I10 Essential (primary) hypertension: Secondary | ICD-10-CM

## 2017-12-28 MED ORDER — LISINOPRIL 5 MG PO TABS: 5.0000 mg | ORAL_TABLET | Freq: Every day | ORAL | 1 refills | Status: DC

## 2017-12-28 NOTE — Telephone Encounter (Signed)
Requesting 90 day supply.

## 2017-12-28 NOTE — Telephone Encounter (Signed)
Error

## 2017-12-28 NOTE — Telephone Encounter (Signed)
[facility] pharmacy faxed a refill request for a 90-days supply for the following medication. Thanks CC ° °lisinopril (PRINIVIL,ZESTRIL) 5 MG tablet  ° °

## 2018-06-17 ENCOUNTER — Other Ambulatory Visit: Payer: Self-pay | Admitting: Family Medicine

## 2018-06-17 DIAGNOSIS — I10 Essential (primary) hypertension: Secondary | ICD-10-CM

## 2018-08-29 ENCOUNTER — Other Ambulatory Visit: Payer: Self-pay | Admitting: Family Medicine

## 2018-08-29 DIAGNOSIS — I10 Essential (primary) hypertension: Secondary | ICD-10-CM

## 2018-11-27 ENCOUNTER — Other Ambulatory Visit: Payer: Self-pay | Admitting: Family Medicine

## 2018-11-27 DIAGNOSIS — I10 Essential (primary) hypertension: Secondary | ICD-10-CM
